# Patient Record
Sex: Female | Born: 1980 | Race: Black or African American | Hispanic: No | Marital: Single | Smoking: Never smoker
Health system: Southern US, Community
[De-identification: ages and names within clinical notes are randomized; demographics above are authoritative.]

## PROBLEM LIST (undated history)

## (undated) DIAGNOSIS — Z789 Other specified health status: Secondary | ICD-10-CM

## (undated) DIAGNOSIS — E559 Vitamin D deficiency, unspecified: Secondary | ICD-10-CM

## (undated) DIAGNOSIS — O020 Blighted ovum and nonhydatidiform mole: Secondary | ICD-10-CM

## (undated) HISTORY — DX: Blighted ovum and nonhydatidiform mole: O02.0

## (undated) HISTORY — PX: DILATION AND CURETTAGE OF UTERUS: SHX78

## (undated) HISTORY — DX: Vitamin D deficiency, unspecified: E55.9

## (undated) HISTORY — PX: WISDOM TOOTH EXTRACTION: SHX21

---

## 1998-05-22 ENCOUNTER — Other Ambulatory Visit: Admission: RE | Admit: 1998-05-22 | Discharge: 1998-05-22 | Payer: Self-pay | Admitting: Obstetrics and Gynecology

## 1999-06-14 ENCOUNTER — Other Ambulatory Visit: Admission: RE | Admit: 1999-06-14 | Discharge: 1999-06-14 | Payer: Self-pay | Admitting: *Deleted

## 1999-06-14 ENCOUNTER — Other Ambulatory Visit: Admission: RE | Admit: 1999-06-14 | Discharge: 1999-06-14 | Payer: Self-pay | Admitting: Emergency Medicine

## 2003-05-08 ENCOUNTER — Other Ambulatory Visit: Admission: RE | Admit: 2003-05-08 | Discharge: 2003-05-08 | Payer: Self-pay | Admitting: Obstetrics & Gynecology

## 2011-04-13 ENCOUNTER — Encounter (HOSPITAL_COMMUNITY): Payer: Self-pay

## 2011-04-13 ENCOUNTER — Other Ambulatory Visit: Payer: Self-pay | Admitting: Registered Nurse

## 2011-04-13 ENCOUNTER — Ambulatory Visit (HOSPITAL_COMMUNITY)
Admission: RE | Admit: 2011-04-13 | Discharge: 2011-04-13 | Disposition: A | Discharge status: Home / Self Care | Payer: BC Managed Care – PPO | Source: Ambulatory Visit | Attending: Registered Nurse | Admitting: Registered Nurse

## 2011-04-13 DIAGNOSIS — O209 Hemorrhage in early pregnancy, unspecified: Secondary | ICD-10-CM | POA: Insufficient documentation

## 2011-04-13 DIAGNOSIS — O3680X0 Pregnancy with inconclusive fetal viability, not applicable or unspecified: Secondary | ICD-10-CM | POA: Insufficient documentation

## 2011-04-13 DIAGNOSIS — R58 Hemorrhage, not elsewhere classified: Secondary | ICD-10-CM

## 2011-04-19 ENCOUNTER — Encounter (HOSPITAL_COMMUNITY): Payer: Self-pay | Admitting: Pharmacy Technician

## 2011-04-20 ENCOUNTER — Encounter (HOSPITAL_COMMUNITY): Payer: Self-pay | Admitting: *Deleted

## 2011-04-20 ENCOUNTER — Other Ambulatory Visit: Payer: Self-pay | Admitting: Obstetrics and Gynecology

## 2011-04-22 ENCOUNTER — Encounter (HOSPITAL_COMMUNITY): Payer: Self-pay | Admitting: *Deleted

## 2011-04-22 ENCOUNTER — Encounter (HOSPITAL_COMMUNITY): Payer: Self-pay | Admitting: Anesthesiology

## 2011-04-22 ENCOUNTER — Encounter (HOSPITAL_COMMUNITY): Payer: Self-pay | Admitting: Obstetrics and Gynecology

## 2011-04-22 ENCOUNTER — Ambulatory Visit (HOSPITAL_COMMUNITY): Payer: BC Managed Care – PPO | Admitting: Anesthesiology

## 2011-04-22 ENCOUNTER — Ambulatory Visit (HOSPITAL_COMMUNITY)
Admission: RE | Admit: 2011-04-22 | Discharge: 2011-04-22 | Disposition: A | Discharge status: Home / Self Care | Payer: BC Managed Care – PPO | Source: Ambulatory Visit | Attending: Obstetrics and Gynecology | Admitting: Obstetrics and Gynecology

## 2011-04-22 ENCOUNTER — Other Ambulatory Visit: Payer: Self-pay | Admitting: Obstetrics and Gynecology

## 2011-04-22 ENCOUNTER — Encounter (HOSPITAL_COMMUNITY)
Admission: RE | Disposition: A | Discharge status: Home / Self Care | Payer: Self-pay | Source: Ambulatory Visit | Attending: Obstetrics and Gynecology

## 2011-04-22 DIAGNOSIS — O021 Missed abortion: Secondary | ICD-10-CM | POA: Insufficient documentation

## 2011-04-22 DIAGNOSIS — D259 Leiomyoma of uterus, unspecified: Secondary | ICD-10-CM | POA: Diagnosis present

## 2011-04-22 DIAGNOSIS — O020 Blighted ovum and nonhydatidiform mole: Secondary | ICD-10-CM | POA: Diagnosis present

## 2011-04-22 HISTORY — DX: Other specified health status: Z78.9

## 2011-04-22 HISTORY — PX: DILATION AND EVACUATION: SHX1459

## 2011-04-22 SURGERY — DILATION AND EVACUATION, UTERUS
Anesthesia: Monitor Anesthesia Care | Site: Vagina

## 2011-04-22 MED ORDER — MIDAZOLAM HCL 5 MG/5ML IJ SOLN
INTRAMUSCULAR | Status: DC | PRN
  Administered 2011-04-22: 2 mg via INTRAVENOUS

## 2011-04-22 MED ORDER — LIDOCAINE HCL 2 % IJ SOLN
INTRAMUSCULAR | Status: AC
  Filled 2011-04-22: qty 1

## 2011-04-22 MED ORDER — IBUPROFEN 600 MG PO TABS: 600.0000 mg | ORAL_TABLET | Freq: Four times a day (QID) | ORAL | Status: AC

## 2011-04-22 MED ORDER — DOXYCYCLINE HYCLATE 50 MG PO CAPS: 100.0000 mg | ORAL_CAPSULE | Freq: Two times a day (BID) | ORAL | Status: AC

## 2011-04-22 MED ORDER — ONDANSETRON HCL 4 MG/2ML IJ SOLN
INTRAMUSCULAR | Status: DC | PRN
  Administered 2011-04-22: 4 mg via INTRAVENOUS

## 2011-04-22 MED ORDER — DEXAMETHASONE SODIUM PHOSPHATE 10 MG/ML IJ SOLN
INTRAMUSCULAR | Status: DC | PRN
  Administered 2011-04-22: 10 mg via INTRAVENOUS

## 2011-04-22 MED ORDER — LIDOCAINE HCL (CARDIAC) 20 MG/ML IV SOLN
INTRAVENOUS | Status: DC | PRN
  Administered 2011-04-22: 20 mg via INTRAVENOUS

## 2011-04-22 MED ORDER — ONDANSETRON HCL 4 MG/2ML IJ SOLN
INTRAMUSCULAR | Status: AC
  Filled 2011-04-22: qty 2

## 2011-04-22 MED ORDER — METHYLERGONOVINE MALEATE 0.2 MG PO TABS: 0.2000 mg | ORAL_TABLET | Freq: Four times a day (QID) | ORAL | Status: AC

## 2011-04-22 MED ORDER — FENTANYL CITRATE 0.05 MG/ML IJ SOLN
INTRAMUSCULAR | Status: AC
  Filled 2011-04-22: qty 2

## 2011-04-22 MED ORDER — LACTATED RINGERS IV SOLN
INTRAVENOUS | Status: DC
  Administered 2011-04-22: 11:00:00 via INTRAVENOUS

## 2011-04-22 MED ORDER — DEXAMETHASONE SODIUM PHOSPHATE 10 MG/ML IJ SOLN
INTRAMUSCULAR | Status: AC
  Filled 2011-04-22: qty 1

## 2011-04-22 MED ORDER — LIDOCAINE HCL (CARDIAC) 20 MG/ML IV SOLN
INTRAVENOUS | Status: AC
  Filled 2011-04-22: qty 5

## 2011-04-22 MED ORDER — KETOROLAC TROMETHAMINE 60 MG/2ML IM SOLN
INTRAMUSCULAR | Status: AC
  Filled 2011-04-22: qty 2

## 2011-04-22 MED ORDER — LIDOCAINE HCL 2 % IJ SOLN
INTRAMUSCULAR | Status: DC | PRN
  Administered 2011-04-22: 10 mL

## 2011-04-22 MED ORDER — PROPOFOL 10 MG/ML IV EMUL
INTRAVENOUS | Status: DC | PRN
  Administered 2011-04-22: 300 ug/kg/min via INTRAVENOUS

## 2011-04-22 MED ORDER — PROPOFOL 10 MG/ML IV EMUL
INTRAVENOUS | Status: AC
  Filled 2011-04-22: qty 50

## 2011-04-22 MED ORDER — KETOROLAC TROMETHAMINE 30 MG/ML IJ SOLN
INTRAMUSCULAR | Status: DC | PRN
  Administered 2011-04-22: 30 mg via INTRAVENOUS

## 2011-04-22 MED ORDER — MIDAZOLAM HCL 2 MG/2ML IJ SOLN
INTRAMUSCULAR | Status: AC
  Filled 2011-04-22: qty 2

## 2011-04-22 MED ORDER — FENTANYL CITRATE 0.05 MG/ML IJ SOLN
INTRAMUSCULAR | Status: DC | PRN
  Administered 2011-04-22: 25 ug via INTRAVENOUS
  Administered 2011-04-22: 50 ug via INTRAVENOUS
  Administered 2011-04-22: 25 ug via INTRAVENOUS

## 2011-04-22 MED ORDER — KETOROLAC TROMETHAMINE 60 MG/2ML IM SOLN
INTRAMUSCULAR | Status: DC | PRN
  Administered 2011-04-22: 30 mg via INTRAMUSCULAR

## 2011-04-22 SURGICAL SUPPLY — 18 items
CATH ROBINSON RED A/P 16FR (CATHETERS) ×2 IMPLANT
CLOTH BEACON ORANGE TIMEOUT ST (SAFETY) ×2 IMPLANT
DECANTER SPIKE VIAL GLASS SM (MISCELLANEOUS) ×2 IMPLANT
DILATOR CANAL MILEX (MISCELLANEOUS) IMPLANT
GLOVE SURG SS PI 6.5 STRL IVOR (GLOVE) ×4 IMPLANT
GOWN PREVENTION PLUS LG XLONG (DISPOSABLE) ×2 IMPLANT
KIT BERKELEY 1ST TRIMESTER 3/8 (MISCELLANEOUS) ×2 IMPLANT
NEEDLE SPNL 22GX3.5 QUINCKE BK (NEEDLE) ×2 IMPLANT
NS IRRIG 1000ML POUR BTL (IV SOLUTION) ×2 IMPLANT
PACK VAGINAL MINOR WOMEN LF (CUSTOM PROCEDURE TRAY) ×2 IMPLANT
PAD PREP 24X48 CUFFED NSTRL (MISCELLANEOUS) ×2 IMPLANT
SET BERKELEY SUCTION TUBING (SUCTIONS) ×2 IMPLANT
SYR CONTROL 10ML LL (SYRINGE) ×2 IMPLANT
TOWEL OR 17X24 6PK STRL BLUE (TOWEL DISPOSABLE) ×4 IMPLANT
VACURETTE 10 RIGID CVD (CANNULA) IMPLANT
VACURETTE 7MM CVD STRL WRAP (CANNULA) IMPLANT
VACURETTE 8 RIGID CVD (CANNULA) ×2 IMPLANT
VACURETTE 9 RIGID CVD (CANNULA) IMPLANT

## 2011-04-22 NOTE — Discharge Instructions (Signed)
Dilation and Curettage or Vacuum Curettage Care After Refer to this sheet in the next few weeks. These instructions provide you with general information on caring for yourself after your procedure. Your caregiver may also give you more specific instructions. Your treatment has been planned according to current medical practices, but problems sometimes occur. Call your caregiver if you have any problems or questions after your procedure. HOME CARE INSTRUCTIONS   Do not drive for 24 hours.   Wait 1 week before returning to strenuous activities.   Take your temperature 2 times a day for 4 days and write it down. Provide these temperatures to your caregiver if you develop a fever.   Avoid long periods of standing, and do no heavy lifting (more than 10 pounds or 4.5 kg), pushing, or pulling.   Limit stair climbing to once or twice a day.   Take rest periods often.   You may resume your usual diet.   Drink enough fluids to keep your urine clear or pale yellow.   You should return to your usual bowel function. If constipation should occur, you may:   Take a mild laxative with permission from your caregiver.   Add fruit and bran to your diet.   Drink more fluids.   Take showers instead of baths until your caregiver gives you permission to take baths.   Do not go swimming or use a hot tub until your caregiver gives you permission.   Try to have someone with you or available to you the first 24 to 48 hours, especially if you had a general anesthetic.   Do not douche, use tampons, or have intercourse until after your follow-up appointment, or when your caregiver approves.   Only take over-the-counter or prescription medicines for pain, discomfort, or fever as directed by your caregiver. Do not take aspirin. It can cause bleeding.   If a prescription was given, follow your caregiver's directions.   Keep all your follow-up appointments recommended by your caregiver.  SEEK MEDICAL CARE  IF:   You have increasing cramps or pain not relieved with medicine.   You have abdominal pain which does not seem to be related to the same area of earlier cramping and pain.   You have bad smelling vaginal discharge.   You have a rash.   You have problems with any medicine.  SEEK IMMEDIATE MEDICAL CARE IF:   You have bleeding that is heavier than a normal menstrual period.   You have a fever.   You have chest pain.   You have shortness of breath.   You feel dizzy or feel like fainting.   You pass out.   You have pain in your shoulder strap area.   You have heavy vaginal bleeding with or without blood clots.  MAKE SURE YOU:   Understand these instructions.   Will watch your condition.   Will get help right away if you are not doing well or get worse.  Document Released: 02/19/2000 Document Revised: 11/03/2010 Document Reviewed: 09/18/2008 [facility] Patient Information 2012 [place], [place].Dilation and Curettage or Vacuum Curettage Care After Refer to this sheet in the next few weeks. These instructions provide you with general information on caring for yourself after your procedure. Your caregiver may also give you more specific instructions. Your treatment has been planned according to current medical practices, but problems sometimes occur. Call your caregiver if you have any problems or questions after your procedure. HOME CARE INSTRUCTIONS   Do not drive for  24 hours.   Wait 1 week before returning to strenuous activities.   Take your temperature 2 times a day for 4 days and write it down. Provide these temperatures to your caregiver if you develop a fever.   Avoid long periods of standing, and do no heavy lifting (more than 10 pounds or 4.5 kg), pushing, or pulling.   Limit stair climbing to once or twice a day.   Take rest periods often.   You may resume your usual diet.   Drink enough fluids to keep your urine clear or pale yellow.   You should return  to your usual bowel function. If constipation should occur, you may:   Take a mild laxative with permission from your caregiver.   Add fruit and bran to your diet.   Drink more fluids.   Take showers instead of baths until your caregiver gives you permission to take baths.   Do not go swimming or use a hot tub until your caregiver gives you permission.   Try to have someone with you or available to you the first 24 to 48 hours, especially if you had a general anesthetic.   Do not douche, use tampons, or have intercourse until after your follow-up appointment, or when your caregiver approves.   Only take over-the-counter or prescription medicines for pain, discomfort, or fever as directed by your caregiver. Do not take aspirin. It can cause bleeding.   If a prescription was given, follow your caregiver's directions.   Keep all your follow-up appointments recommended by your caregiver.  SEEK MEDICAL CARE IF:   You have increasing cramps or pain not relieved with medicine.   You have abdominal pain which does not seem to be related to the same area of earlier cramping and pain.   You have bad smelling vaginal discharge.   You have a rash.   You have problems with any medicine.  SEEK IMMEDIATE MEDICAL CARE IF:   You have bleeding that is heavier than a normal menstrual period.   You have a fever.   You have chest pain.   You have shortness of breath.   You feel dizzy or feel like fainting.   You pass out.   You have pain in your shoulder strap area.   You have heavy vaginal bleeding with or without blood clots.  MAKE SURE YOU:   Understand these instructions.   Will watch your condition.   Will get help right away if you are not doing well or get worse.  Document Released: 02/19/2000 Document Revised: 11/03/2010 Document Reviewed: 09/18/2008 [facility] Patient Information 2012 [place], [place].DISCHARGE INSTRUCTIONS: D&C / D&E The following instructions have been  prepared to help you care for yourself upon your return home.   Personal hygiene: Marland Kitchen Use sanitary pads for vaginal drainage, not tampons. . Shower the day after your procedure. . NO tub baths, pools or Jacuzzis for 2-3 weeks. . Wipe front to back after using the bathroom.  Activity and limitations: . Do NOT drive or operate any equipment for 24 hours. The effects of anesthesia are still present and drowsiness may result. . Do NOT rest in bed all day. . Walking is encouraged. . Walk up and down stairs slowly. . You may resume your normal activity in one to two days or as indicated by your physician.  Sexual activity: NO intercourse for at least 2 weeks after the procedure, or as indicated by your physician.  Diet: Eat a light meal as desired this  evening. You may resume your usual diet tomorrow.  Return to work: You may resume your work activities in one to two days or as indicated by your doctor.  What to expect after your surgery: Expect to have vaginal bleeding/discharge for 2-3 days and spotting for up to 10 days. It is not unusual to have soreness for up to 1-2 weeks. You may have a slight burning sensation when you urinate for the first day. Mild cramps may continue for a couple of days. You may have a regular period in 2-6 weeks.  Call your doctor for any of the following: . Excessive vaginal bleeding, saturating and changing one pad every hour. . Inability to urinate 6 hours after discharge from hospital. . Pain not relieved by pain medication. . Fever of 100.4 F or greater. . Unusual vaginal discharge or odor.  Return to office ________________ Call for an appointment ___________________  Patient's signature: ______________________  Nurse's signature ________________________  Post Anesthesia Care Unit [phone]

## 2011-04-22 NOTE — Anesthesia Postprocedure Evaluation (Signed)
  Anesthesia Post-op Note  Patient: Julie Poole  Procedure(s) Performed: Procedure(s) (LRB): DILATATION AND EVACUATION (N/A)  Patient Location: PACU  Anesthesia Type: MAC  Level of Consciousness: awake, alert  and oriented  Airway and Oxygen Therapy: Patient Spontanous Breathing  Post-op Pain: none  Post-op Assessment: Post-op Vital signs reviewed, Patient's Cardiovascular Status Stable, Respiratory Function Stable, Patent Airway, No signs of Nausea or vomiting and Pain level controlled  Post-op Vital Signs: Reviewed and stable  Complications: No apparent anesthesia complications

## 2011-04-22 NOTE — H&P (Signed)
Julie Poole is an 31 y.o. female. G2 P0 for D&E for failed pregnancy in the first trimester Pertinent Gynecological History: Menses: LMP 02-21-11 Bleeding: NONE Contraception: none DES exposure: denies Blood transfusions: none Sexually transmitted diseases: no past history Previous GYN Procedures: DNC  Last mammogram: NA Date: NA OB History: G2, PO   Menstrual History: Menarche age: 19 Patient's last menstrual period was 02/21/2011.    Past Medical History  Diagnosis Date  . No pertinent past medical history     Past Surgical History  Procedure Date  . Dilation and curettage of uterus     History reviewed. No pertinent family history.  Social History:  reports that she has never smoked. She does not have any smokeless tobacco history on file. She reports that she drinks about .6 ounces of alcohol per week. She reports that she does not use illicit drugs.  Allergies: No Known Allergies  Prescriptions prior to admission  Medication Sig Dispense Refill  . metroNIDAZOLE (METROGEL) 0.75 % gel Apply 1 application topically at bedtime.      . Prenatal Vit-Fe Fumarate-FA (PRENATAL MULTIVITAMIN) TABS Take 1 tablet by mouth daily.        Review of Systems  Constitutional: Negative.   HENT: Negative.   Eyes: Negative.   Respiratory: Negative.   Cardiovascular: Negative.   Gastrointestinal: Negative.   Genitourinary: Negative.        No vaginal bleeding, no cramping Small fibroids noted on ultrasound  Musculoskeletal: Negative.   Skin: Negative.   Neurological: Negative.   Endo/Heme/Allergies: Negative.   Psychiatric/Behavioral: Negative.     Blood pressure 106/58, pulse 78, temperature 97.5 F (36.4 C), temperature source Oral, resp. rate 18, height 5\' 5"  (1.651 m), weight 144 lb (65.318 kg), last menstrual period 02/21/2011, SpO2 100.00%. Physical Exam  Constitutional: She is oriented to person, place, and time. She appears well-developed and well-nourished.    HENT:  Head: Normocephalic and atraumatic.  Neck: Normal range of motion. Neck supple.  Cardiovascular: Normal rate and regular rhythm.   Respiratory: Effort normal.  GI: Soft.  Musculoskeletal: Normal range of motion.  Neurological: She is alert and oriented to person, place, and time.  Skin: Skin is warm and dry.    Ultrasound from 04-13-11 shows evidence of blighted ovum and small uterine fibroids    Assessment/Plan: Blighted ovum Uterine fibroids  Plan:  Options reviewed. Pt wants D&E  Julie Poole P 04/22/2011, 11:17 AM

## 2011-04-22 NOTE — Transfer of Care (Signed)
Immediate Anesthesia Transfer of Care Note  Patient: Julie Poole  Procedure(s) Performed: Procedure(s) (LRB): DILATATION AND EVACUATION (N/A)  Patient Location: PACU  Anesthesia Type: MAC  Level of Consciousness: oriented and sedated  Airway & Oxygen Therapy: Patient Spontanous Breathing and Patient connected to nasal cannula oxygen  Post-op Assessment: Report given to PACU RN and Post -op Vital signs reviewed and stable  Post vital signs: stable  Complications: No apparent anesthesia complications

## 2011-04-22 NOTE — Anesthesia Preprocedure Evaluation (Addendum)
Anesthesia Evaluation  Patient identified by MRN, date of birth, ID band Patient awake    Reviewed: Allergy & Precautions, H&P , NPO status , Patient's Chart, lab work & pertinent test results  Airway Mallampati: II TM Distance: >3 FB Neck ROM: full    Dental No notable dental hx. (+) Teeth Intact   Pulmonary neg pulmonary ROS,  clear to auscultation  Pulmonary exam normal       Cardiovascular neg cardio ROS Normal    Neuro/Psych Negative Neurological ROS     GI/Hepatic negative GI ROS, Neg liver ROS,   Endo/Other  Negative Endocrine ROS  Renal/GU negative Renal ROS  Genitourinary negative   Musculoskeletal   Abdominal Normal abdominal exam  (+)   Peds  Hematology negative hematology ROS (+)   Anesthesia Other Findings   Reproductive/Obstetrics negative OB ROS (+) Pregnancy                           Anesthesia Physical Anesthesia Plan  ASA: I  Anesthesia Plan: MAC   Post-op Pain Management:    Induction:   Airway Management Planned: Mask and Natural Airway  Additional Equipment:   Intra-op Plan:   Post-operative Plan:   Informed Consent: I have reviewed the patients History and Physical, chart, labs and discussed the procedure including the risks, benefits and alternatives for the proposed anesthesia with the patient or authorized representative who has indicated his/her understanding and acceptance.     Plan Discussed with: Anesthesiologist, CRNA and Surgeon  Anesthesia Plan Comments:        Anesthesia Quick Evaluation

## 2011-04-22 NOTE — Op Note (Signed)
04/22/2011  12:33 PM  PATIENT:  Julie Poole  31 y.o. female G2P0 with a blighted ovum diagnosed on 04/13/2011 by ultrasound  PRE-OPERATIVE DIAGNOSIS:  Blighted ovum  POST-OPERATIVE DIAGNOSIS:  Blighted ovum  PROCEDURE:  Procedure(s): DILATATION AND EVACUATION  SURGEON:  Surgeon(s): Hal Morales, MD  ASSISTANTS: none   ANESTHESIA:   local and monitored anesthesia care  ESTIMATED BLOOD LOSS:  Less than 25 cc  COMPLICATIONS: none  FINDINGS:the uterus was approximately 8 weeks size and a moderate amount of products of conception were obtained at the time of D&C  BLOOD ADMINISTERED:none  LOCAL MEDICATIONS USED:  XYLOCAINE  and Amount: 10 ml  SPECIMEN:  Source of Specimen:  products of conception  DISPOSITION OF SPECIMEN:  PATHOLOGY  COUNTS:  YES  DESCRIPTION OF PROCEDURE:  The patient was taken the operating room after appropriate identification placed on the operating table. After the attainment of adequate anesthesia she was placed in the lithotomy position. Perineum and vagina were prepped with multiple areas of Betadine and a straight in and out catheter used to empty the bladder. The perineum was draped as a sterile field. A weighted speculum was placed in the posterior vagina and a paracervical block achieved a total of 10 cc of 2% Xylocaine and the 5 and 7:00 positions. A single-tooth tenaculum was placed on the anterior cervix and the cervix dilated to accommodate a number 8 suction catheter. The suction catheter was then used to suction evacuate all quadrants of the uterus. A sharp curet was used to ensure that all products of conception had been removed. All instruments were then removed from the vagina and the patient had her anesthetic reversed and was taken to the recovery room in satisfactory condition having tolerated the procedure well sponge and instrument counts correct.  PLAN OF CARE:discharge home after post anesthesia care unit  PATIENT DISPOSITION:   PACU - hemodynamically stable.   Delay start of Pharmacological VTE agent (>24hrs) due to surgical blood loss or risk of bleeding:  not applicable  Blood type B+  Reynold Mantell P, MD 12:33 PM

## 2011-04-25 ENCOUNTER — Encounter (HOSPITAL_COMMUNITY): Payer: Self-pay | Admitting: Obstetrics and Gynecology

## 2011-05-10 ENCOUNTER — Encounter (INDEPENDENT_AMBULATORY_CARE_PROVIDER_SITE_OTHER): Payer: BC Managed Care – PPO | Admitting: Obstetrics and Gynecology

## 2011-05-10 DIAGNOSIS — N898 Other specified noninflammatory disorders of vagina: Secondary | ICD-10-CM

## 2011-08-05 ENCOUNTER — Telehealth: Payer: Self-pay | Admitting: Obstetrics and Gynecology

## 2011-08-05 NOTE — Telephone Encounter (Signed)
Tc to pt per telephone call. Pt c/o vaginal odor. No itching. Pt tried Metrogel(5day regimen) x3days, did not use for 2days, then resumed use x remaining 2 days. Advised pt to always complete meds consistently as prescribed to avoid medication resistance against any infection. Pt voices understanding. Appt sched 08/08/11@10 :00a with ar for eval.

## 2011-08-05 NOTE — Telephone Encounter (Signed)
Triage received 

## 2011-08-08 ENCOUNTER — Encounter: Payer: Self-pay | Admitting: Obstetrics and Gynecology

## 2011-08-08 ENCOUNTER — Ambulatory Visit (INDEPENDENT_AMBULATORY_CARE_PROVIDER_SITE_OTHER): Payer: BC Managed Care – PPO | Admitting: Obstetrics and Gynecology

## 2011-08-08 VITALS — BP 102/64 | Resp 16 | Ht 65.0 in | Wt 144.0 lb

## 2011-08-08 DIAGNOSIS — N949 Unspecified condition associated with female genital organs and menstrual cycle: Secondary | ICD-10-CM

## 2011-08-08 DIAGNOSIS — Z139 Encounter for screening, unspecified: Secondary | ICD-10-CM

## 2011-08-08 DIAGNOSIS — E559 Vitamin D deficiency, unspecified: Secondary | ICD-10-CM | POA: Insufficient documentation

## 2011-08-08 DIAGNOSIS — N898 Other specified noninflammatory disorders of vagina: Secondary | ICD-10-CM

## 2011-08-08 DIAGNOSIS — N760 Acute vaginitis: Secondary | ICD-10-CM

## 2011-08-08 DIAGNOSIS — Z113 Encounter for screening for infections with a predominantly sexual mode of transmission: Secondary | ICD-10-CM

## 2011-08-08 DIAGNOSIS — A499 Bacterial infection, unspecified: Secondary | ICD-10-CM

## 2011-08-08 DIAGNOSIS — B9689 Other specified bacterial agents as the cause of diseases classified elsewhere: Secondary | ICD-10-CM

## 2011-08-08 HISTORY — DX: Vitamin D deficiency, unspecified: E55.9

## 2011-08-08 LAB — POCT WET PREP (WET MOUNT)

## 2011-08-08 MED ORDER — TINIDAZOLE 500 MG PO TABS: 2.0000 g | ORAL_TABLET | Freq: Every day | ORAL | Status: AC

## 2011-08-08 NOTE — Progress Notes (Signed)
C/o vag odor and d/c  Filed Vitals:   08/08/11 1018  BP: 102/64  Resp: 16   ROS: noncontributory  Pelvic exam:  VULVA: normal appearing vulva with no masses, tenderness or lesions,  VAGINA: normal appearing vagina with normal color and discharge, no lesions, white d/c with odor CERVIX: normal appearing cervix without discharge or lesions,  UTERUS: uterus is normal size, shape, consistency and nontender,  ADNEXA: normal adnexa in size, nontender and no masses.  Results for orders placed in visit on 08/08/11  POCT WET PREP (WET MOUNT)      Component Value Range   Source Wet Prep POC vaginal     WBC, Wet Prep HPF POC       Bacteria Wet Prep HPF POC mod     BACTERIA WET PREP MORPHOLOGY POC       Clue Cells Wet Prep HPF POC Moderate     CLUE CELLS WET PREP WHIFF POC       Yeast Wet Prep HPF POC None     KOH Wet Prep POC       Trichomonas Wet Prep HPF POC none     pH 5.5     Wet prep Recurrent BV-Tindamax Check vit D RTO Oct for AEX

## 2011-08-08 NOTE — Progress Notes (Signed)
Addended by: Marla Roe A on: 08/08/2011 01:27 PM   Modules accepted: Orders

## 2011-08-09 LAB — GC/CHLAMYDIA PROBE AMP, GENITAL: Chlamydia, DNA Probe: NEGATIVE

## 2011-08-10 ENCOUNTER — Telehealth: Payer: Self-pay | Admitting: Obstetrics and Gynecology

## 2011-08-10 ENCOUNTER — Telehealth: Payer: Self-pay

## 2011-08-10 DIAGNOSIS — E559 Vitamin D deficiency, unspecified: Secondary | ICD-10-CM

## 2011-08-10 NOTE — Telephone Encounter (Signed)
Pt was called and given Vit-D protocol. Rx was called to [facility] for 1 capsule 2x wk for 8wks #28 0rf. Future lab ordered. Mathis Bud

## 2011-08-10 NOTE — Telephone Encounter (Signed)
Done

## 2011-08-10 NOTE — Telephone Encounter (Signed)
To close encounter. Julie Poole  

## 2011-08-25 ENCOUNTER — Telehealth: Payer: Self-pay | Admitting: Obstetrics and Gynecology

## 2011-08-25 ENCOUNTER — Other Ambulatory Visit: Payer: Self-pay | Admitting: Obstetrics and Gynecology

## 2011-08-25 MED ORDER — TINIDAZOLE 500 MG PO TABS: 500.0000 mg | ORAL_TABLET | Freq: Every day | ORAL | Status: AC

## 2011-08-25 NOTE — Telephone Encounter (Signed)
Triage/call bck. °

## 2011-08-25 NOTE — Telephone Encounter (Signed)
TC from pt.  States took Tindamax and the used Refresh but had menses at the same time.   States feels menses caused flare of BV.  Has definitely improved but still has  slight odor. Requestin RF Tindamax.  Per EP OK to do so.  To schedule appt if sx continue. Pt verbalizes comprehension.

## 2011-08-25 NOTE — Telephone Encounter (Signed)
TC to pt. LM to return call regarding message. 

## 2011-11-24 ENCOUNTER — Ambulatory Visit (INDEPENDENT_AMBULATORY_CARE_PROVIDER_SITE_OTHER): Payer: BC Managed Care – PPO

## 2011-11-24 VITALS — BP 98/66 | Resp 16 | Wt 140.0 lb

## 2011-11-24 DIAGNOSIS — A499 Bacterial infection, unspecified: Secondary | ICD-10-CM

## 2011-11-24 DIAGNOSIS — N760 Acute vaginitis: Secondary | ICD-10-CM

## 2011-11-24 DIAGNOSIS — B9689 Other specified bacterial agents as the cause of diseases classified elsewhere: Secondary | ICD-10-CM

## 2011-11-24 DIAGNOSIS — E559 Vitamin D deficiency, unspecified: Secondary | ICD-10-CM

## 2011-11-24 DIAGNOSIS — N898 Other specified noninflammatory disorders of vagina: Secondary | ICD-10-CM

## 2011-11-24 LAB — POCT WET PREP (WET MOUNT)
Clue Cells Wet Prep Whiff POC: POSITIVE
pH: 5

## 2011-11-24 MED ORDER — CLINDAMYCIN HCL 300 MG PO CAPS: 300.0000 mg | ORAL_CAPSULE | Freq: Two times a day (BID) | ORAL | Status: DC

## 2011-11-24 NOTE — Progress Notes (Signed)
Color: white Odor: yes Itching:no Thin:yes Thick:yes Fever:no Dyspareunia:no Hx PID:no HX STD:yes Pelvic Pain:no Desires Gc/CT:no Desires HIV,RPR,HbsAG:no  Pt c/o recurrent BV always after menses and IC. Pt does use condoms. Pt frustrated wants to know how to prevent it.

## 2011-11-24 NOTE — Patient Instructions (Signed)
Bacterial Vaginosis Bacterial vaginosis (BV) is a vaginal infection where the normal balance of bacteria in the vagina is disrupted. The normal balance is then replaced by an overgrowth of certain bacteria. There are several different kinds of bacteria that can cause BV. BV is the most common vaginal infection in women of childbearing age. CAUSES   The cause of BV is not fully understood. BV develops when there is an increase or imbalance of harmful bacteria.   Some activities or behaviors can upset the normal balance of bacteria in the vagina and put women at increased risk including:   Having a new sex partner or multiple sex partners.   Douching.   Using an intrauterine device (IUD) for contraception.   It is not clear what role sexual activity plays in the development of BV. However, women that have never had sexual intercourse are rarely infected with BV.  Women do not get BV from toilet seats, bedding, swimming pools or from touching objects around them.  SYMPTOMS   Grey vaginal discharge.   A fish-like odor with discharge, especially after sexual intercourse.   Itching or burning of the vagina and vulva.   Burning or pain with urination.   Some women have no signs or symptoms at all.  DIAGNOSIS  Your caregiver must examine the vagina for signs of BV. Your caregiver will perform lab tests and look at the sample of vaginal fluid through a microscope. They will look for bacteria and abnormal cells (clue cells), a pH test higher than 4.5, and a positive amine test all associated with BV.  RISKS AND COMPLICATIONS   Pelvic inflammatory disease (PID).   Infections following gynecology surgery.   Developing HIV.   Developing herpes virus.  TREATMENT  Sometimes BV will clear up without treatment. However, all women with symptoms of BV should be treated to avoid complications, especially if gynecology surgery is planned. Female partners generally do not need to be treated. However,  BV may spread between female sex partners so treatment is helpful in preventing a recurrence of BV.   BV may be treated with antibiotics. The antibiotics come in either pill or vaginal cream forms. Either can be used with nonpregnant or pregnant women, but the recommended dosages differ. These antibiotics are not harmful to the baby.   BV can recur after treatment. If this happens, a second round of antibiotics will often be prescribed.   Treatment is important for pregnant women. If not treated, BV can cause a premature delivery, especially for a pregnant woman who had a premature birth in the past. All pregnant women who have symptoms of BV should be checked and treated.   For chronic reoccurrence of BV, treatment with a type of prescribed gel vaginally twice a week is helpful.  HOME CARE INSTRUCTIONS   Finish all medication as directed by your caregiver.   Do not have sex until treatment is completed.   Tell your sexual partner that you have a vaginal infection. They should see their caregiver and be treated if they have problems, such as a mild rash or itching.   Practice safe sex. Use condoms. Only have 1 sex partner.  PREVENTION  Basic prevention steps can help reduce the risk of upsetting the natural balance of bacteria in the vagina and developing BV:  Do not have sexual intercourse (be abstinent).   Do not douche.   Use all of the medicine prescribed for treatment of BV, even if the signs and symptoms go away.     Tell your sex partner if you have BV. That way, they can be treated, if needed, to prevent reoccurrence.  SEEK MEDICAL CARE IF:   Your symptoms are not improving after 3 days of treatment.   You have increased discharge, pain, or fever.  MAKE SURE YOU:   Understand these instructions.   Will watch your condition.   Will get help right away if you are not doing well or get worse.  FOR MORE INFORMATION  Division of STD Prevention (DSTDP), Centers for Disease  Control and Prevention: www.cdc.gov/std American Social Health Association (ASHA): www.ashastd.org  Document Released: 02/21/2005 Document Revised: 02/10/2011 Document Reviewed: 08/14/2008 [employer] Patient Information 2012 [employer]. 

## 2011-12-15 ENCOUNTER — Encounter: Payer: Self-pay | Admitting: Obstetrics and Gynecology

## 2011-12-15 ENCOUNTER — Ambulatory Visit (INDEPENDENT_AMBULATORY_CARE_PROVIDER_SITE_OTHER): Payer: BC Managed Care – PPO | Admitting: Obstetrics and Gynecology

## 2011-12-15 VITALS — BP 102/60 | Wt 141.0 lb

## 2011-12-15 DIAGNOSIS — N644 Mastodynia: Secondary | ICD-10-CM

## 2011-12-15 DIAGNOSIS — M940 Chondrocostal junction syndrome [Tietze]: Secondary | ICD-10-CM

## 2011-12-15 DIAGNOSIS — R079 Chest pain, unspecified: Secondary | ICD-10-CM

## 2011-12-15 DIAGNOSIS — R3915 Urgency of urination: Secondary | ICD-10-CM

## 2011-12-15 LAB — POCT URINALYSIS DIPSTICK
Glucose, UA: NEGATIVE
Spec Grav, UA: <=1.005
Urobilinogen, UA: NEGATIVE
pH, UA: 6

## 2011-12-15 MED ORDER — NAPROXEN SODIUM 550 MG PO TABS: 550.0000 mg | ORAL_TABLET | Freq: Two times a day (BID) | ORAL | Status: AC

## 2011-12-15 MED ORDER — FLUCONAZOLE 150 MG PO TABS: 150.0000 mg | ORAL_TABLET | Freq: Once | ORAL | Status: AC

## 2011-12-15 NOTE — Patient Instructions (Signed)
Costochondritis Costochondritis (Tietze syndrome), or costochondral separation, is a swelling and irritation (inflammation) of the tissue (cartilage) that connects your ribs with your breastbone (sternum). It may occur on its own (spontaneously), through damage caused by an accident (trauma), or simply from coughing or minor exercise. It may take up to 6 weeks to get better and longer if you are unable to be conservative in your activities. HOME CARE INSTRUCTIONS   Avoid exhausting physical activity. Try not to strain your ribs during normal activity. This would include any activities using chest, belly (abdominal), and side muscles, especially if heavy weights are used.  Use ice for 15 to 20 minutes per hour while awake for the first 2 days. Place the ice in a plastic bag, and place a towel between the bag of ice and your skin.  Only take over-the-counter or prescription medicines for pain, discomfort, or fever as directed by your caregiver. SEEK IMMEDIATE MEDICAL CARE IF:   Your pain increases or you are very uncomfortable.  You have a fever.  You develop difficulty with your breathing.  You cough up blood.  You develop worse chest pains, shortness of breath, sweating, or vomiting.  You develop new, unexplained problems (symptoms). MAKE SURE YOU:   Understand these instructions.  Will watch your condition.  Will get help right away if you are not doing well or get worse. Document Released: 12/01/2004 Document Revised: 05/16/2011 Document Reviewed: 10/10/2007 [employer] Patient Information 2013 [employer].  

## 2011-12-15 NOTE — Progress Notes (Signed)
31 YO complains of left breast pain, off & on for years.  Has had a breast U/S in the past. Patient feels the pain more with inhales (dull pull) and is also felt in her back-usually will last about 4 days.  Maternal Grandmother had breast cancer at age 18.  O: Breasts: Bilteral-no masses, skin changes, retractions, dimpling, adenopathy or nipple discharge       Very tender chest wall between the left 4-8 ribs  U/A-negative  A: ? Costochondritis vs Mastodynia      Urinary Frequency  P: NSAIDs as directed pc x 5 days      Will consult Dr. Pennie Rushing about further evaluation      Diflucan 150 mg #1 1 po stat (recent treatment for BV, now with     mild itch)-requests something for yeast      RTO- as scheduled or prn  Lilibeth Opie, PA-C

## 2011-12-16 ENCOUNTER — Ambulatory Visit
Admission: RE | Admit: 2011-12-16 | Discharge: 2011-12-16 | Disposition: A | Discharge status: Home / Self Care | Payer: BC Managed Care – PPO | Source: Ambulatory Visit | Attending: Obstetrics and Gynecology | Admitting: Obstetrics and Gynecology

## 2011-12-16 ENCOUNTER — Telehealth: Payer: Self-pay

## 2011-12-16 DIAGNOSIS — R079 Chest pain, unspecified: Secondary | ICD-10-CM

## 2011-12-16 NOTE — Telephone Encounter (Signed)
Pc to pt per vph recs rgdg chest x-ray. Pt agrees. Order e-pres to [facility] [phone]). Pt aware of daily hours chest x-rays are done and knows may walk-in when ready to have done. Pt voices understanding.

## 2011-12-16 NOTE — Telephone Encounter (Signed)
Message copied by Raylene Everts on Fri Dec 16, 2011 12:14 PM ------      Message from: Henreitta Leber      Created: Fri Dec 16, 2011 11:49 AM       Would also do CXR      ----- Message -----         From: Henreitta Leber, PA         Sent: 12/15/2011  11:15 AM           To: Hal Morales, MD            Pt. has what I believe to be costochondritis (has had intermittently for years) and has had a breast ultrasound in the past but she believes it is her breast that is sore. How do you recommend we evaluate. (MGM with breast CA, age 61).  EP

## 2011-12-16 NOTE — Addendum Note (Signed)
Addended by: Lerry Liner D on: 12/16/2011 12:14 PM   Modules accepted: Orders

## 2011-12-16 NOTE — Progress Notes (Signed)
Patient with recurrent left upper chest vs breast pain over several years with negative breast evaluation.  After consult with Dr. Pennie Rushing it is recommended for patient to have a CXR, A-P and lateral for left chest pain.  Notify patient of recommendation and schedule chest x-ray (A-P and lateral).  Genie Wenke, PA-C

## 2011-12-22 ENCOUNTER — Telehealth: Payer: Self-pay | Admitting: Obstetrics and Gynecology

## 2011-12-22 NOTE — Telephone Encounter (Signed)
TC to pt. Informed of negative chest X-ray.   Pt requests mammogram.  EP made aware

## 2012-03-23 ENCOUNTER — Ambulatory Visit: Payer: BC Managed Care – PPO | Admitting: Obstetrics and Gynecology

## 2012-03-23 ENCOUNTER — Encounter: Payer: Self-pay | Admitting: Obstetrics and Gynecology

## 2012-03-23 VITALS — BP 98/70 | HR 76 | Ht 65.0 in | Wt 140.0 lb

## 2012-03-23 DIAGNOSIS — Z124 Encounter for screening for malignant neoplasm of cervix: Secondary | ICD-10-CM

## 2012-03-23 NOTE — Progress Notes (Signed)
Last Pap: 12/2010 per pt WNL: Yes Regular Periods:yes Contraception: none  Monthly Breast exam:yes sometimes Tetanus<42yrs:no pt unsure  Nl.Bladder Function:yes Daily BMs:yes Healthy Diet:yes Calcium:no Mammogram:no Date of Mammogram: n/a Exercise:yes Have often Exercise: 3 times per week  Seatbelt: yes Abuse at home: no Stressful work:no Sigmoid-colonoscopy: n/a Bone Density: No PCP: n/a Change in PMH: no changes  Change in [facility]: no changes  Pt declines STD testing today BP 98/70  Pulse 76  Ht 5\' 5"  (1.651 m)  Wt 140 lb (63.504 kg)  BMI 23.30 kg/m2  LMP 03/11/2012 Pt with complaints:no and she desires BC.  She used ocps and nuvaring in the past.  No contraindications Physical Examination: General appearance - alert, well appearing, and in no distress Mental status - normal mood, behavior, speech, dress, motor activity, and thought processes Neck - supple, no significant adenopathy,  thyroid exam: thyroid is normal in size without nodules or tenderness Chest - clear to auscultation, no wheezes, rales or rhonchi, symmetric air entry Heart - normal rate and regular rhythm Abdomen - soft, nontender, nondistended, no masses or organomegaly Breasts - breasts appear normal, no suspicious masses, no skin or nipple changes or axillary nodes Pelvic - normal external genitalia, vulva, vagina, cervix, uterus and adnexa Rectal - rectal exam not indicated Back exam - full range of motion, no tenderness, palpable spasm or pain on motion Neurological - alert, oriented, normal speech, no focal findings or movement disorder noted Musculoskeletal - no joint tenderness, deformity or swelling Extremities - no edema, redness or tenderness in the calves or thighs Skin - normal coloration and turgor, no rashes, no suspicious skin lesions noted Routine exam Pap sent yes Mammogram due no ocps used for contraception.  Lo/lo estrin rx and samples given topt with verbal and sritten  instructions RT 3-4 monts for f/u

## 2012-03-26 LAB — PAP IG W/ RFLX HPV ASCU

## 2012-04-10 ENCOUNTER — Telehealth: Payer: Self-pay | Admitting: Obstetrics and Gynecology

## 2012-04-10 NOTE — Telephone Encounter (Signed)
Tc to pt regarding msg.  Pt states had been having vaginal itching.  Went to Urgent Care and was told she had a UTI but wet prep not done to confirm why the she was having the vaginal itching.  Was given antibxs and Diflucan and was told that the Diflucan should treat her vaginal itching.   Pt states she took the Diflucan while she was on the antibxs and is still having the vaginal itching.  Pt scheduled for an appt on Monday 04/16/12 after she is sure that she will not be on her cycle to have itching evaluated w/ EP.

## 2012-04-16 ENCOUNTER — Encounter: Payer: BC Managed Care – PPO | Admitting: Obstetrics and Gynecology

## 2012-04-21 ENCOUNTER — Other Ambulatory Visit: Payer: Self-pay | Admitting: Obstetrics and Gynecology

## 2012-04-21 ENCOUNTER — Other Ambulatory Visit: Payer: Self-pay

## 2012-05-02 ENCOUNTER — Encounter: Payer: BC Managed Care – PPO | Admitting: Obstetrics and Gynecology

## 2012-07-03 ENCOUNTER — Ambulatory Visit: Payer: Self-pay | Admitting: Internal Medicine

## 2012-08-17 ENCOUNTER — Other Ambulatory Visit: Payer: BC Managed Care – PPO | Admitting: Internal Medicine

## 2012-08-17 ENCOUNTER — Other Ambulatory Visit: Payer: Self-pay | Admitting: Internal Medicine

## 2012-08-17 DIAGNOSIS — Z0000 Encounter for general adult medical examination without abnormal findings: Secondary | ICD-10-CM

## 2012-08-17 LAB — CBC WITH DIFFERENTIAL/PLATELET
Basophils Absolute: 0 10*3/uL (ref 0.0–0.1)
Lymphocytes Relative: 44 % (ref 12–46)
Lymphs Abs: 1.9 10*3/uL (ref 0.7–4.0)
Neutrophils Relative %: 45 % (ref 43–77)
Platelets: 345 10*3/uL (ref 150–400)
RBC: 4.68 MIL/uL (ref 3.87–5.11)
RDW: 13.3 % (ref 11.5–15.5)
WBC: 4.4 10*3/uL (ref 4.0–10.5)

## 2012-08-17 LAB — COMPREHENSIVE METABOLIC PANEL
ALT: 11 U/L (ref 0–35)
AST: 15 U/L (ref 0–37)
CO2: 26 mEq/L (ref 19–32)
Calcium: 9.5 mg/dL (ref 8.4–10.5)
Chloride: 106 mEq/L (ref 96–112)
Sodium: 140 mEq/L (ref 135–145)
Total Bilirubin: 0.6 mg/dL (ref 0.3–1.2)
Total Protein: 6.6 g/dL (ref 6.0–8.3)

## 2012-08-17 LAB — LIPID PANEL
Cholesterol: 138 mg/dL (ref 0–200)
VLDL: 10 mg/dL (ref 0–40)

## 2012-08-17 LAB — TSH: TSH: 1.512 u[IU]/mL (ref 0.350–4.500)

## 2012-08-18 LAB — VITAMIN D 25 HYDROXY (VIT D DEFICIENCY, FRACTURES): Vit D, 25-Hydroxy: 25 ng/mL — ABNORMAL LOW (ref 30–89)

## 2012-08-21 ENCOUNTER — Ambulatory Visit (INDEPENDENT_AMBULATORY_CARE_PROVIDER_SITE_OTHER): Payer: BC Managed Care – PPO | Admitting: Internal Medicine

## 2012-08-21 ENCOUNTER — Encounter: Payer: Self-pay | Admitting: Internal Medicine

## 2012-08-21 VITALS — BP 108/66 | HR 80 | Temp 99.0°F | Ht 66.0 in | Wt 141.5 lb

## 2012-08-21 DIAGNOSIS — E559 Vitamin D deficiency, unspecified: Secondary | ICD-10-CM

## 2012-08-21 DIAGNOSIS — Z0000 Encounter for general adult medical examination without abnormal findings: Secondary | ICD-10-CM

## 2012-08-21 LAB — POCT URINALYSIS DIPSTICK
Bilirubin, UA: NEGATIVE
Ketones, UA: NEGATIVE
Protein, UA: NEGATIVE
Spec Grav, UA: <=1.005
pH, UA: 6.5

## 2012-10-29 NOTE — Progress Notes (Signed)
Vaginal Discharge/Discomfort/Itching  Subjective:   Julie Poole is an 32y.o. woman who presents c/o d/c. See by AR on 08/08/11 for d/c as well; gc/ct neg then. Vit D low=10. S/p D&E 2/'13 for SAB. Color: white  Odor: yes  Itching:no  Thin:yes  Thick:yes  Fever:no  Dyspareunia:no  Hx PID:no  HX STD:yes  Pelvic Pain:no  Desires Gc/CT:no  Desires HIV,RPR,HbsAG:no  Pt c/o recurrent BV always after menses and IC. Pt does use condoms.  Pt frustrated wants to know how to prevent it.  Has used Tindamax, Metrogel, & Rephresh in the past  ..Patient's last menstrual period was 10/29/2011. .. Past Medical History  Diagnosis Date  . No pertinent past medical history   . Blighted ovum    Objective: nml size uterus/adnexa; no CMT Vulvar exudate: color creamy white, consistency adherent, odor KOH-whiff test positive Vaginal lesions:  None Wet prep: many clue, neg otherwise  Assessment: 1. Recurrent BV 2. H/o low Vit D 08/08/11=10 Plan: Additional Tests:none indicated Medications: Cleocin Rx Counseling:  Pericare rev'd. rec'd daily probiotic; condoms w/ IC;    Follow-up Oct for Pap/Aex w/ EP if possible to discuss other treatment options such as Boric Acid supp, etc  Coreon Simkins H Late entry from 11/24/11

## 2013-01-10 ENCOUNTER — Other Ambulatory Visit: Payer: Self-pay

## 2013-02-03 ENCOUNTER — Encounter: Payer: Self-pay | Admitting: Internal Medicine

## 2013-02-03 NOTE — Progress Notes (Signed)
   Subjective:    Patient ID: Julie Poole, female    DOB: 1980/09/03, 32 y.o.   MRN: [id number]  HPI 32 year old White female presents to office for the first time today for health maintenance. Dr. Normand Sloop is GYN physician.  Past medical history: Patient had  D & E for failed pregnancy in first trimester February 2013 by Dr. Pennie Rushing. G2 para 0. Ultrasound prior to D&E showed blighted ovum and small uterine fibroids. Uses condoms for contraception. Menarche at age 16. History of recurrent bacterial vaginosis. History of vitamin D deficiency.   Social history: Nonsmoker. Has about one alcoholic drink a week. Does not use drugs. 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  No known drug allergies. Had tetanus immunization in 2005.  Family history: Maternal grandmother with history of breast cancer at age 78. Heart disease and stroke in paternal grandfather. Diabetes in mother. Diabetes in paternal uncle.    Review of Systems  Constitutional: Negative.   All other systems reviewed and are negative.       Objective:   Physical Exam  Vitals reviewed. Constitutional: She is oriented to person, place, and time. She appears well-developed and well-nourished. No distress.  HENT:  Head: Normocephalic and atraumatic.  Right Ear: External ear normal.  Left Ear: External ear normal.  Mouth/Throat: Oropharynx is clear and moist.  Eyes: Conjunctivae and EOM are normal. Pupils are equal, round, and reactive to light. Right eye exhibits no discharge. Left eye exhibits no discharge.  Neck: Neck supple. No JVD present. No thyromegaly present.  Cardiovascular: Normal rate, regular rhythm and normal heart sounds.   No murmur heard. Pulmonary/Chest:  Breasts normal female  Abdominal: Soft. Bowel sounds are normal. She exhibits no distension and no mass. There is no rebound and no guarding.  Genitourinary:  Deferred to GYN  Musculoskeletal: Normal range of motion. She exhibits  no edema.  Lymphadenopathy:    She has no cervical adenopathy.  Neurological: She is oriented to person, place, and time. She has normal reflexes. No cranial nerve deficit. Coordination normal.  Skin: Skin is warm and dry. No rash noted. She is not diaphoretic.  Psychiatric: She has a normal mood and affect. Her behavior is normal. Judgment and thought content normal.          Assessment & Plan:  Normal exam  Vitamin D deficiency. Recommend 2000 units vitamin D 3 over-the-counter daily  Plan: Return in 1-2 years or as needed.

## 2013-02-03 NOTE — Patient Instructions (Signed)
Take vitamin D 2000 units daily. Return in 1-2 years or as needed.

## 2013-04-09 ENCOUNTER — Ambulatory Visit (INDEPENDENT_AMBULATORY_CARE_PROVIDER_SITE_OTHER): Payer: BC Managed Care – PPO | Admitting: Internal Medicine

## 2013-04-09 ENCOUNTER — Encounter: Payer: Self-pay | Admitting: Internal Medicine

## 2013-04-09 VITALS — BP 110/72 | HR 76 | Temp 98.9°F | Wt 144.0 lb

## 2013-04-09 DIAGNOSIS — Z23 Encounter for immunization: Secondary | ICD-10-CM

## 2013-04-09 DIAGNOSIS — L988 Other specified disorders of the skin and subcutaneous tissue: Secondary | ICD-10-CM

## 2013-04-09 DIAGNOSIS — R238 Other skin changes: Secondary | ICD-10-CM

## 2013-04-09 MED ORDER — TETANUS-DIPHTH-ACELL PERTUSSIS 5-2.5-18.5 LF-MCG/0.5 IM SUSP: 0.5000 mL | Freq: Once | INTRAMUSCULAR | Status: AC

## 2013-04-09 NOTE — Addendum Note (Signed)
Addended by: Brett Canales on: 04/09/2013 04:00 PM   Modules accepted: Orders

## 2013-04-09 NOTE — Progress Notes (Signed)
   Subjective:    Patient ID: Julie Poole, female    DOB: May 23, 1980, 33 y.o.   MRN: [id number]  HPI Patient has noticed tiny nodule left anterior leg medial aspect to mid tibia since December. No injury noted. Area is not tender. It has not enlarged but stayed the same size.    Review of Systems     Objective:   Physical Exam   3 mm nodule medial mid para tibial area. Nontender.     Assessment & Plan:  I think this is probably a small nodule in the skin. Could be a tiny lipoma or other benign lesion. She will observe it for now and let he know if it enlarges or becomes tender. It's hardly palpable.  Tetanus immunization update given today.

## 2013-04-09 NOTE — Patient Instructions (Signed)
Continue to observe lesion left anterior leg for increased size or tenderness. Otherwise I think it's benign and will not be an issue. Tetanus immunization update given today.

## 2013-06-06 ENCOUNTER — Ambulatory Visit: Payer: BC Managed Care – PPO | Admitting: Internal Medicine

## 2013-09-03 ENCOUNTER — Other Ambulatory Visit: Payer: BC Managed Care – PPO | Admitting: Internal Medicine

## 2013-09-03 DIAGNOSIS — Z0000 Encounter for general adult medical examination without abnormal findings: Secondary | ICD-10-CM

## 2013-09-03 DIAGNOSIS — Z13220 Encounter for screening for lipoid disorders: Secondary | ICD-10-CM

## 2013-09-03 DIAGNOSIS — Z130 Encounter for screening for diseases of the blood and blood-forming organs and certain disorders involving the immune mechanism: Secondary | ICD-10-CM

## 2013-09-03 DIAGNOSIS — E559 Vitamin D deficiency, unspecified: Secondary | ICD-10-CM

## 2013-09-03 DIAGNOSIS — Z1329 Encounter for screening for other suspected endocrine disorder: Secondary | ICD-10-CM

## 2013-09-03 LAB — CBC WITH DIFFERENTIAL/PLATELET
Basophils Absolute: 0 10*3/uL (ref 0.0–0.1)
Basophils Relative: 1 % (ref 0–1)
EOS ABS: 0 10*3/uL (ref 0.0–0.7)
Eosinophils Relative: 1 % (ref 0–5)
HCT: 37.6 % (ref 36.0–46.0)
Hemoglobin: 12.7 g/dL (ref 12.0–15.0)
LYMPHS ABS: 2.2 10*3/uL (ref 0.7–4.0)
LYMPHS PCT: 47 % — AB (ref 12–46)
MCH: 27.1 pg (ref 26.0–34.0)
MCHC: 33.8 g/dL (ref 30.0–36.0)
MCV: 80.2 fL (ref 78.0–100.0)
Monocytes Absolute: 0.4 10*3/uL (ref 0.1–1.0)
Monocytes Relative: 8 % (ref 3–12)
NEUTROS PCT: 43 % (ref 43–77)
Neutro Abs: 2 10*3/uL (ref 1.7–7.7)
PLATELETS: 314 10*3/uL (ref 150–400)
RBC: 4.69 MIL/uL (ref 3.87–5.11)
RDW: 13.4 % (ref 11.5–15.5)
WBC: 4.7 10*3/uL (ref 4.0–10.5)

## 2013-09-03 LAB — COMPREHENSIVE METABOLIC PANEL
ALT: 12 U/L (ref 0–35)
AST: 17 U/L (ref 0–37)
Albumin: 4.3 g/dL (ref 3.5–5.2)
Alkaline Phosphatase: 40 U/L (ref 39–117)
BILIRUBIN TOTAL: 0.6 mg/dL (ref 0.2–1.2)
BUN: 9 mg/dL (ref 6–23)
CO2: 26 meq/L (ref 19–32)
Calcium: 9.3 mg/dL (ref 8.4–10.5)
Chloride: 106 mEq/L (ref 96–112)
Creat: 0.74 mg/dL (ref 0.50–1.10)
Glucose, Bld: 86 mg/dL (ref 70–99)
Potassium: 4 mEq/L (ref 3.5–5.3)
SODIUM: 138 meq/L (ref 135–145)
TOTAL PROTEIN: 6.3 g/dL (ref 6.0–8.3)

## 2013-09-03 LAB — LIPID PANEL
CHOLESTEROL: 150 mg/dL (ref 0–200)
HDL: 37 mg/dL — ABNORMAL LOW (ref >39)
LDL Cholesterol: 102 mg/dL — ABNORMAL HIGH (ref 0–99)
Total CHOL/HDL Ratio: 4.1 Ratio
Triglycerides: 53 mg/dL (ref <150)
VLDL: 11 mg/dL (ref 0–40)

## 2013-09-03 LAB — TSH: TSH: 1.134 u[IU]/mL (ref 0.350–4.500)

## 2013-09-04 LAB — VITAMIN D 25 HYDROXY (VIT D DEFICIENCY, FRACTURES): Vit D, 25-Hydroxy: 18 ng/mL — ABNORMAL LOW (ref 30–89)

## 2013-09-05 ENCOUNTER — Encounter: Payer: Self-pay | Admitting: Internal Medicine

## 2013-09-05 ENCOUNTER — Ambulatory Visit (INDEPENDENT_AMBULATORY_CARE_PROVIDER_SITE_OTHER): Payer: BC Managed Care – PPO | Admitting: Internal Medicine

## 2013-09-05 VITALS — BP 106/72 | HR 80 | Temp 98.0°F | Ht 66.0 in | Wt 144.5 lb

## 2013-09-05 DIAGNOSIS — Z0000 Encounter for general adult medical examination without abnormal findings: Secondary | ICD-10-CM

## 2013-09-05 DIAGNOSIS — H65199 Other acute nonsuppurative otitis media, unspecified ear: Secondary | ICD-10-CM

## 2013-09-05 DIAGNOSIS — H65193 Other acute nonsuppurative otitis media, bilateral: Secondary | ICD-10-CM

## 2013-09-05 DIAGNOSIS — R51 Headache: Secondary | ICD-10-CM

## 2013-09-05 DIAGNOSIS — H60399 Other infective otitis externa, unspecified ear: Secondary | ICD-10-CM

## 2013-09-05 DIAGNOSIS — H60393 Other infective otitis externa, bilateral: Secondary | ICD-10-CM

## 2013-09-05 DIAGNOSIS — R519 Headache, unspecified: Secondary | ICD-10-CM

## 2013-09-05 LAB — POCT URINALYSIS DIPSTICK
BILIRUBIN UA: NEGATIVE
GLUCOSE UA: NEGATIVE
Ketones, UA: NEGATIVE
LEUKOCYTES UA: NEGATIVE
Nitrite, UA: NEGATIVE
Protein, UA: NEGATIVE
Spec Grav, UA: 1.02
Urobilinogen, UA: NEGATIVE
pH, UA: 6

## 2013-09-05 MED ORDER — NEOMYCIN-POLYMYXIN-HC 3.5-10000-1 OT SOLN: OTIC | Status: AC

## 2013-09-05 MED ORDER — AMOXICILLIN 500 MG PO CAPS: 500.0000 mg | ORAL_CAPSULE | Freq: Three times a day (TID) | ORAL | Status: AC

## 2013-09-05 NOTE — Progress Notes (Signed)
Subjective:    Patient ID: Julie Poole, female    DOB: 1980-06-24, 33 y.o.   MRN: [id number]  HPI  33 year old 33 female planning a wedding in December to Tibes who is in the [employer] in [place]. She will be moving to [place]. Planning wedding is been a bit stressful. She's had maybe 3 headaches a month. They are throbbing and pounding. Not responding to Tylenol. Not sure what's causing the headaches. Generally awakens with them. Sister has history of migraine headaches. She was given food list to see if oozer triggering these headaches. It may be related to stress. Should try Excedrin Migraine instead of Tylenol for headaches and let me know if not working. This also had some bilateral ear pain and soreness. Went swimming last week. Ears have been irritated. Doesn't have infection of pierced earrings.  Dr. Charlesetta Garibaldi is GYN physician. Patient had a D and E for failed pregnancy in first trimester February 2013 by Dr. Leo Grosser. Gravida 2 para 0. Ultrasound prior to D and E showed blighted ova and small uterine fibroids. Menarche at age 33. History of recurrent bacterial vaginosis. History of vitamin D deficiency.  Social history: Nonsmoker. Has about one alcoholic drink a week. Does not use drugs. 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  No known drug allergies.  Family history: Maternal grandmother with history of breast cancer at age 66. Heart disease and stroke in paternal grandfather. Diabetes in mother. Diabetes in paternal uncle.      Review of Systems  All other systems reviewed and are negative.  Symptoms as above     Objective:   Physical Exam  Vitals reviewed. Constitutional: She is oriented to person, place, and time. She appears well-developed and well-nourished. No distress.  HENT:  Head: Normocephalic and atraumatic.  Mouth/Throat: Oropharynx is clear and moist. No oropharyngeal exudate.  Both TMs are dull and think bilaterally and slightly  full. Both external ear canals are rate and  Eyes: Conjunctivae and EOM are normal. Pupils are equal, round, and reactive to light. Right eye exhibits no discharge. Left eye exhibits no discharge. No scleral icterus.  Neck: Normal range of motion. Neck supple. No JVD present. No tracheal deviation present.  Cardiovascular: Normal rate, regular rhythm and normal heart sounds.  Exam reveals no gallop.   No murmur heard. Pulmonary/Chest: Effort normal and breath sounds normal. No respiratory distress. She has no wheezes. She has no rales.  Breasts normal female  Abdominal: Bowel sounds are normal. She exhibits no distension and no mass. There is no tenderness. There is no rebound and no guarding.  Genitourinary:  Deferred to GYN  Musculoskeletal: Normal range of motion. She exhibits no edema.  Lymphadenopathy:    She has no cervical adenopathy.  Neurological: She is alert and oriented to person, place, and time. She has normal reflexes. Coordination normal.  Skin: Skin is warm and dry. No rash noted. She is not diaphoretic. No erythema. No pallor.  Psychiatric: She has a normal mood and affect. Her behavior is normal. Judgment and thought content normal.          Assessment & Plan:  Swimmer's ear-bilateral otitis media and externa prescribe Cortisporin Otic suspension to use 4 drops in each ear 4 times a day for 5 days. Amoxicillin 500 mg 3 times daily for 7 days  Possible migraine headaches-try Excedrin Migraine and let me know if this does not work. 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 Doesn't want  Xanax for anxiety at present time  Health maintenance  Vitamin D deficiency-recommend 2000 units vitamin D 3 daily. Vitamin D level is low at 18.

## 2013-09-18 ENCOUNTER — Telehealth: Payer: Self-pay

## 2013-09-18 MED ORDER — FLUCONAZOLE 150 MG PO TABS: 150.0000 mg | ORAL_TABLET | Freq: Once | ORAL | Status: AC

## 2013-09-18 MED ORDER — ALPRAZOLAM 0.25 MG PO TABS: 0.2500 mg | ORAL_TABLET | Freq: Two times a day (BID) | ORAL | Status: AC | PRN

## 2013-09-18 NOTE — Telephone Encounter (Signed)
Patient states she has a yeast infection from recent antibiotics. Requesting some Diflucan. Also states she would like to have some Xanax for anxiety re : planning a wedding. Ok per Dr. Renold Genta for both of these.

## 2013-10-06 NOTE — Patient Instructions (Signed)
Take amoxicillin 500 mg 3 times daily for 10 days for otitis media and use Cortisporin Otic suspension in ear canal as directed. Take vitamin D supplement 2000 units daily. Try Excedrin Migraine for migraine headaches.

## 2014-01-06 ENCOUNTER — Encounter: Payer: Self-pay | Admitting: Internal Medicine

## 2014-04-24 IMAGING — CR DG CHEST 2V
2 series · 2 of 2 positions shown · non-contrast
Comparison: None.

CLINICAL DATA: Left axillary chest discomfort.

CHEST - 2 VIEW

[view not recorded (1 of 2)]
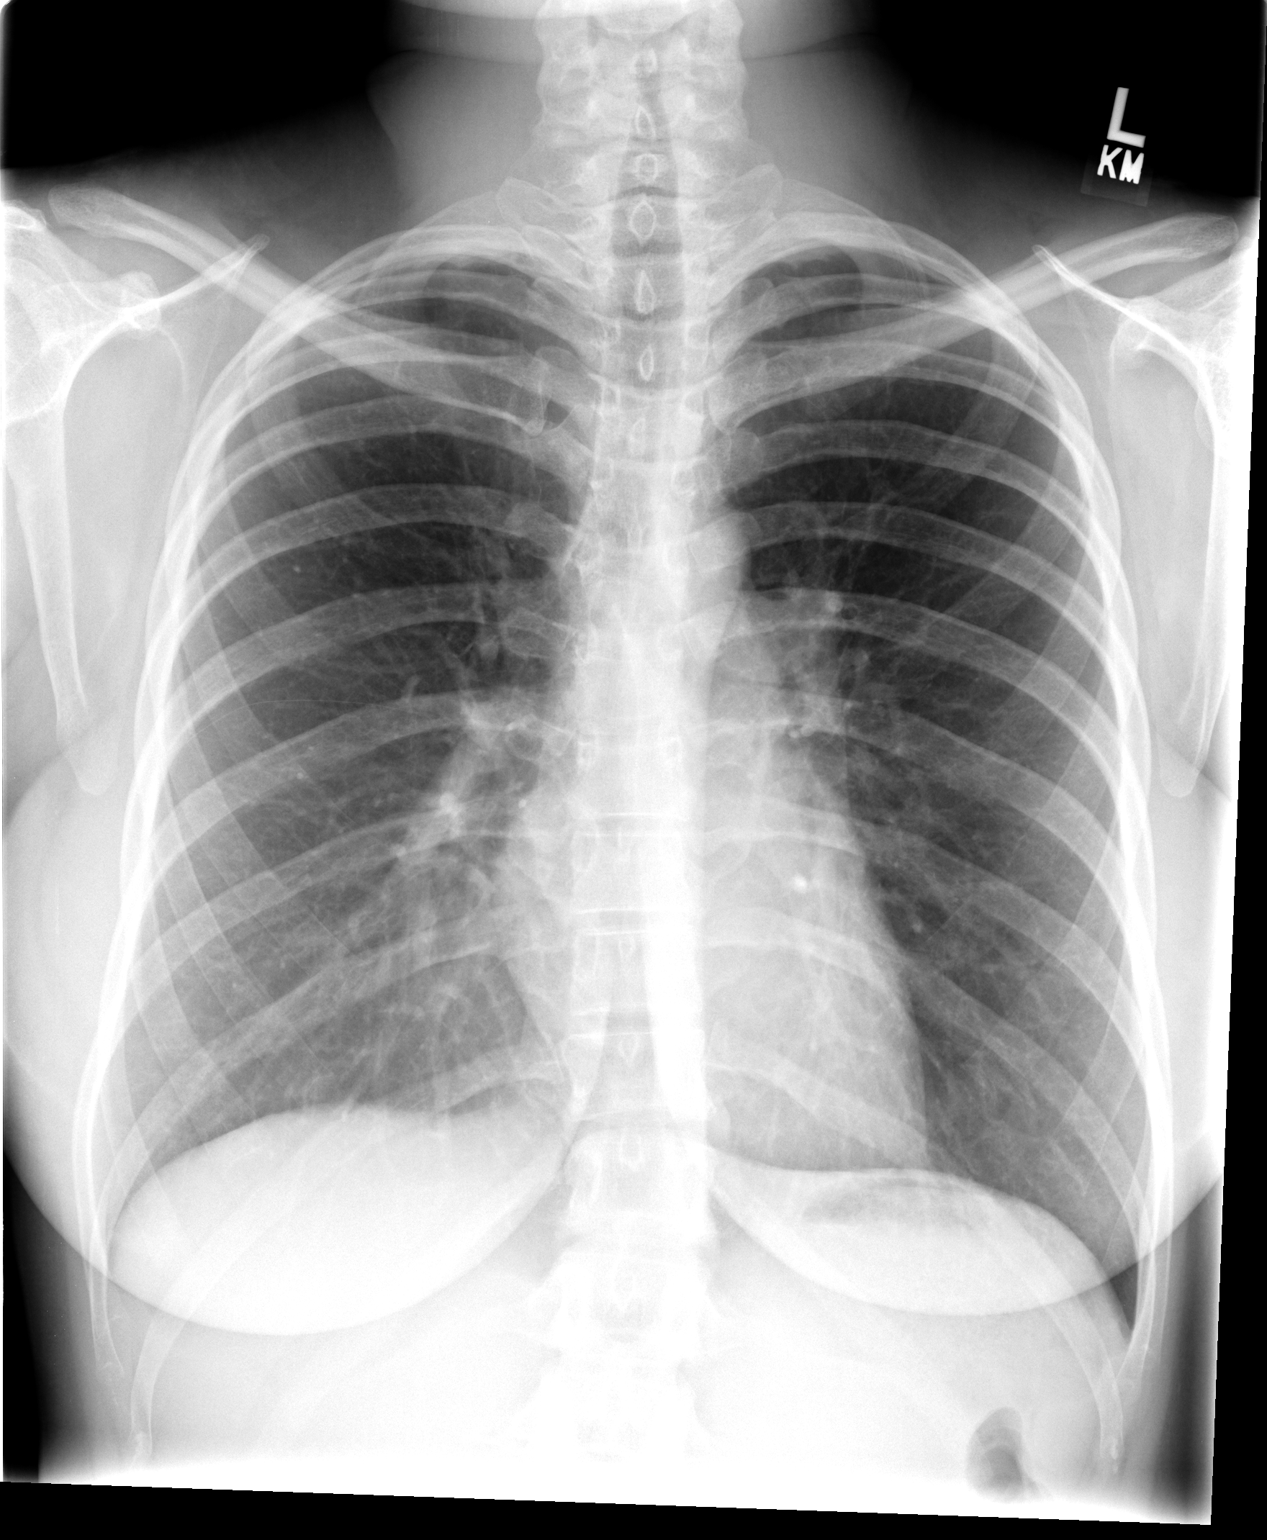

[view not recorded (2 of 2)]
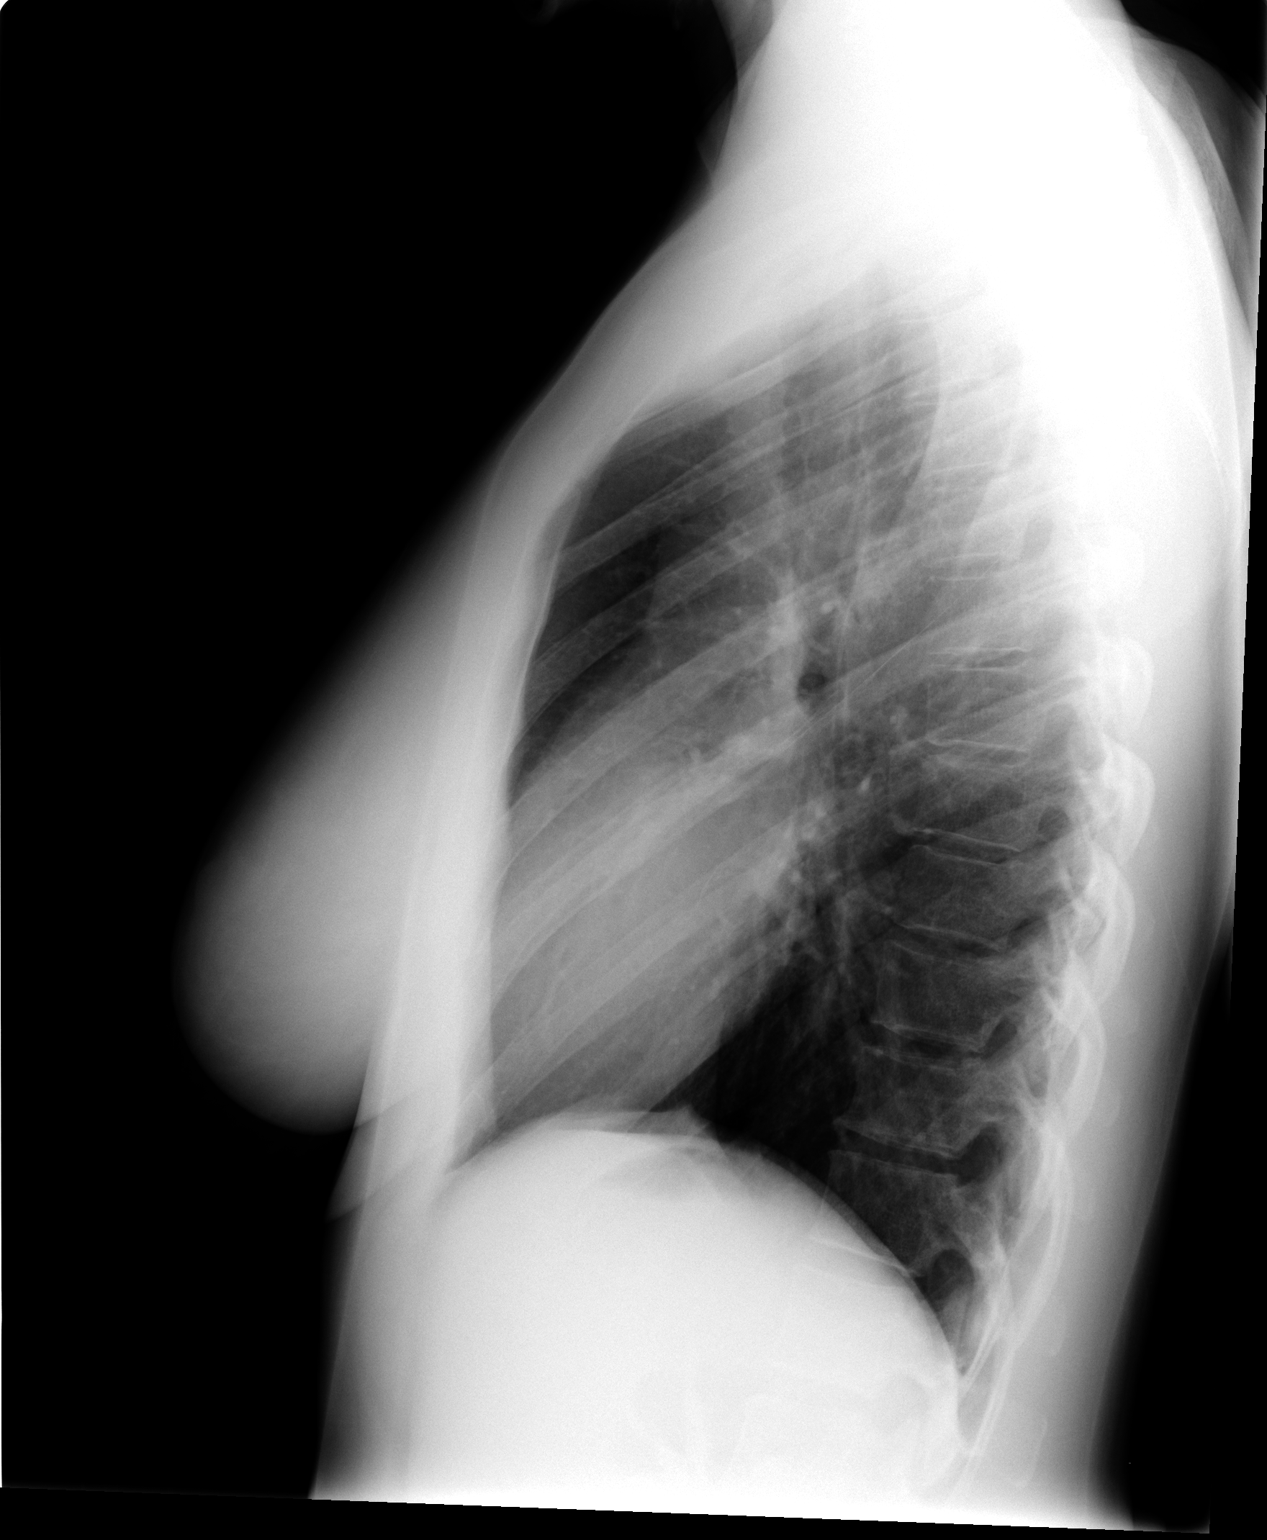

[2 of 2 positions shown; findings below may reference images not displayed]

FINDINGS: Trachea is midline.  Heart size normal.  Minimal biapical
pleural thickening.  Lungs are otherwise clear.  No pleural fluid.
Osseous structures appear grossly intact.
IMPRESSION: Negative.

## 2014-09-04 ENCOUNTER — Other Ambulatory Visit: Payer: BC Managed Care – PPO | Admitting: Internal Medicine

## 2014-09-09 ENCOUNTER — Encounter: Payer: BC Managed Care – PPO | Admitting: Internal Medicine
